# Patient Record
Sex: Male | Born: 1980 | Race: Black or African American | Hispanic: No | Marital: Single | Smoking: Current every day smoker
Health system: Southern US, Community
[De-identification: ages and names within clinical notes are randomized; demographics above are authoritative.]

## PROBLEM LIST (undated history)

## (undated) DIAGNOSIS — Z789 Other specified health status: Secondary | ICD-10-CM

## (undated) HISTORY — PX: NO PAST SURGERIES: SHX2092

## (undated) HISTORY — PX: OTHER SURGICAL HISTORY: SHX169

## (undated) HISTORY — PX: ABDOMINAL SURGERY: SHX537

---

## 2009-12-30 ENCOUNTER — Emergency Department (HOSPITAL_COMMUNITY): Admission: EM | Admit: 2009-12-30 | Discharge: 2009-12-30 | Payer: Self-pay | Admitting: Emergency Medicine

## 2011-11-21 ENCOUNTER — Emergency Department (HOSPITAL_COMMUNITY)
Admission: EM | Admit: 2011-11-21 | Discharge: 2011-11-22 | Disposition: A | Discharge status: Home / Self Care | Payer: Self-pay | Attending: Emergency Medicine | Admitting: Emergency Medicine

## 2011-11-21 ENCOUNTER — Emergency Department (HOSPITAL_COMMUNITY): Payer: Self-pay

## 2011-11-21 ENCOUNTER — Encounter (HOSPITAL_COMMUNITY): Payer: Self-pay | Admitting: *Deleted

## 2011-11-21 DIAGNOSIS — J069 Acute upper respiratory infection, unspecified: Secondary | ICD-10-CM | POA: Insufficient documentation

## 2011-11-21 DIAGNOSIS — R509 Fever, unspecified: Secondary | ICD-10-CM | POA: Insufficient documentation

## 2011-11-21 DIAGNOSIS — R10819 Abdominal tenderness, unspecified site: Secondary | ICD-10-CM | POA: Insufficient documentation

## 2011-11-21 DIAGNOSIS — K529 Noninfective gastroenteritis and colitis, unspecified: Secondary | ICD-10-CM

## 2011-11-21 DIAGNOSIS — R079 Chest pain, unspecified: Secondary | ICD-10-CM | POA: Insufficient documentation

## 2011-11-21 DIAGNOSIS — R1011 Right upper quadrant pain: Secondary | ICD-10-CM | POA: Insufficient documentation

## 2011-11-21 DIAGNOSIS — K5289 Other specified noninfective gastroenteritis and colitis: Secondary | ICD-10-CM | POA: Insufficient documentation

## 2011-11-21 LAB — CBC
HCT: 42.4 % (ref 39.0–52.0)
Hemoglobin: 15.1 g/dL (ref 13.0–17.0)
MCV: 82.3 fL (ref 78.0–100.0)
Platelets: 183 10*3/uL (ref 150–400)

## 2011-11-21 LAB — DIFFERENTIAL
Basophils Absolute: 0 10*3/uL (ref 0.0–0.1)
Eosinophils Absolute: 0.1 10*3/uL (ref 0.0–0.7)
Eosinophils Relative: 1 % (ref 0–5)
Lymphs Abs: 0.7 10*3/uL (ref 0.7–4.0)
Monocytes Absolute: 0.7 10*3/uL (ref 0.1–1.0)
Neutro Abs: 7 10*3/uL (ref 1.7–7.7)

## 2011-11-21 LAB — COMPREHENSIVE METABOLIC PANEL
Alkaline Phosphatase: 116 U/L (ref 39–117)
BUN: 11 mg/dL (ref 6–23)
CO2: 25 mEq/L (ref 19–32)
Chloride: 104 mEq/L (ref 96–112)
Creatinine, Ser: 0.86 mg/dL (ref 0.50–1.35)
GFR calc non Af Amer: >90 mL/min (ref >90)
Glucose, Bld: 93 mg/dL (ref 70–99)
Potassium: 4.2 mEq/L (ref 3.5–5.1)
Total Bilirubin: 0.3 mg/dL (ref 0.3–1.2)

## 2011-11-21 MED ORDER — PANTOPRAZOLE SODIUM 40 MG IV SOLR
40.0000 mg | Freq: Once | INTRAVENOUS | Status: AC
  Administered 2011-11-21: 40 mg via INTRAVENOUS
  Filled 2011-11-21: qty 40

## 2011-11-21 MED ORDER — SODIUM CHLORIDE 0.9 % IV SOLN
Freq: Once | INTRAVENOUS | Status: AC
  Administered 2011-11-21: 21:00:00 via INTRAVENOUS

## 2011-11-21 MED ORDER — PROMETHAZINE HCL 25 MG PO TABS: 25.0000 mg | ORAL_TABLET | Freq: Four times a day (QID) | ORAL | Status: AC | PRN

## 2011-11-21 MED ORDER — HYDROCOD POLST-CHLORPHEN POLST 10-8 MG/5ML PO LQCR: 5.0000 mL | Freq: Two times a day (BID) | ORAL | Status: DC

## 2011-11-21 MED ORDER — SODIUM CHLORIDE 0.9 % IV BOLUS (SEPSIS)
1000.0000 mL | Freq: Once | INTRAVENOUS | Status: AC
  Administered 2011-11-21: 1000 mL via INTRAVENOUS
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
## 2011-11-21 NOTE — ED Notes (Signed)
Pt reports abd pain with n/v/d since Thursday last week.  Pt also reports chest congestioin.

## 2011-11-21 NOTE — ED Notes (Signed)
Pt. Has had a cold since Thurs, then yesterday started with nausea, vomiting, and diarrhea.  He states he has not been taking in many fluids.  Still feels nauseated, dizzy, and general malaise.
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
## 2011-11-21 NOTE — Discharge Instructions (Signed)
Diet for Diarrhea, Adult Having frequent, runny stools (diarrhea) has many causes. Diarrhea may be caused or worsened by food or drink. Diarrhea may be relieved by changing your diet. IF YOU ARE NOT TOLERATING SOLID FOODS:  Drink enough water and fluids to keep your urine clear or pale yellow.   Avoid sugary drinks and sodas as well as milk-based beverages.   Avoid beverages containing caffeine and alcohol.   You may try rehydrating beverages. You can make your own by following this recipe:    tsp table salt.    tsp baking soda.   ? tsp salt substitute (potassium chloride).   1 tbs + 1 tsp sugar.   1 qt water.  As your stools become more solid, you can start eating solid foods. Add foods one at a time. If a certain food causes your diarrhea to get worse, avoid that food and try other foods. A low fiber, low-fat, and lactose-free diet is recommended. Small, frequent meals may be better tolerated.  Starches  Allowed:  White, French, and pita breads, plain rolls, buns, bagels. Plain muffins, matzo. Soda, saltine, or graham crackers. Pretzels, melba toast, zwieback. Cooked cereals made with water: cornmeal, farina, cream cereals. Dry cereals: refined corn, wheat, rice. Potatoes prepared any way without skins, refined macaroni, spaghetti, noodles, refined rice.   Avoid:  Bread, rolls, or crackers made with whole wheat, multi-grains, rye, bran seeds, nuts, or coconut. Corn tortillas or taco shells. Cereals containing whole grains, multi-grains, bran, coconut, nuts, or raisins. Cooked or dry oatmeal. Coarse wheat cereals, granola. Cereals advertised as "high-fiber." Potato skins. Whole grain pasta, wild or brown rice. Popcorn. Sweet potatoes/yams. Sweet rolls, doughnuts, waffles, pancakes, sweet breads.  Vegetables  Allowed: Strained tomato and vegetable juices. Most well-cooked and canned vegetables without seeds. Fresh: Tender lettuce, cucumber without the skin, cabbage, spinach, bean  sprouts.   Avoid: Fresh, cooked, or canned: Artichokes, baked beans, beet greens, broccoli, Brussels sprouts, corn, kale, legumes, peas, sweet potatoes. Cooked: Green or red cabbage, spinach. Avoid large servings of any vegetables, because vegetables shrink when cooked, and they contain more fiber per serving than fresh vegetables.  Fruit  Allowed: All fruit juices except prune juice. Cooked or canned: Apricots, applesauce, cantaloupe, cherries, fruit cocktail, grapefruit, grapes, kiwi, mandarin oranges, peaches, pears, plums, watermelon. Fresh: Apples without skin, ripe banana, grapes, cantaloupe, cherries, grapefruit, peaches, oranges, plums. Keep servings limited to  cup or 1 piece.   Avoid: Fresh: Apple with skin, apricots, mango, pears, raspberries, strawberries. Prune juice, stewed or dried prunes. Dried fruits, raisins, dates. Large servings of all fresh fruits.  Meat and Meat Substitutes  Allowed: Ground or well-cooked tender beef, ham, veal, lamb, pork, or poultry. Eggs, plain cheese. Fish, oysters, shrimp, lobster, other seafoods. Liver, organ meats.   Avoid: Tough, fibrous meats with gristle. Peanut butter, smooth or chunky. Cheese, nuts, seeds, legumes, dried peas, beans, lentils.  Milk  Allowed: Yogurt, lactose-free milk, kefir, drinkable yogurt, buttermilk, soy milk.   Avoid: Milk, chocolate milk, beverages made with milk, such as milk shakes.  Soups  Allowed: Bouillon, broth, or soups made from allowed foods. Any strained soup.   Avoid: Soups made from vegetables that are not allowed, cream or milk-based soups.  Desserts and Sweets  Allowed: Sugar-free gelatin, sugar-free frozen ice pops made without sugar alcohol.   Avoid: Plain cakes and cookies, pie made with allowed fruit, pudding, custard, cream pie. Gelatin, fruit, ice, sherbet, frozen ice pops. Ice cream, ice milk without nuts. Plain hard candy,   honey, jelly, molasses, syrup, sugar, chocolate syrup, gumdrops,  marshmallows.  Fats and Oils  Allowed: Avoid any fats and oils.   Avoid: Seeds, nuts, olives, avocados. Margarine, butter, cream, mayonnaise, salad oils, plain salad dressings made from allowed foods. Plain gravy, crisp bacon without rind.  Beverages  Allowed: Water, decaffeinated teas, oral rehydration solutions, sugar-free beverages.   Avoid: Fruit juices, caffeinated beverages (coffee, tea, soda or pop), alcohol, sports drinks, or lemon-lime soda or pop.  Condiments  Allowed: Ketchup, mustard, horseradish, vinegar, cream sauce, cheese sauce, cocoa powder. Spices in moderation: allspice, basil, bay leaves, celery powder or leaves, cinnamon, cumin powder, curry powder, ginger, mace, marjoram, onion or garlic powder, oregano, paprika, parsley flakes, ground pepper, rosemary, sage, savory, tarragon, thyme, turmeric.   Avoid: Coconut, honey.  Weight Monitoring: Weigh yourself every day. You should weigh yourself in the morning after you urinate and before you eat breakfast. Wear the same amount of clothing when you weigh yourself. Record your weight daily. Bring your recorded weights to your clinic visits. Tell your caregiver right away if you have gained 3 lb/1.4 kg or more in 1 day, 5 lb/2.3 kg in a week, or whatever amount you were told to report. SEEK IMMEDIATE MEDICAL CARE IF:   You are unable to keep fluids down.   You start to throw up (vomit) or diarrhea keeps coming back (persistent).   Abdominal pain develops, increases, or can be felt in one place (localizes).   You have an oral temperature above 102 F (38.9 C), not controlled by medicine.   Diarrhea contains blood or mucus.   You develop excessive weakness, dizziness, fainting, or extreme thirst.  MAKE SURE YOU:   Understand these instructions.   Will watch your condition.   Will get help right away if you are not doing well or get worse.  Document Released: 12/03/2003 Document Revised: 05/25/2011 Document Reviewed:  03/26/2009 [facility] Patient Information 2012 [place], [place].Upper Respiratory Infection, Adult An upper respiratory infection (URI) is also sometimes known as the common cold. The upper respiratory tract includes the nose, sinuses, throat, trachea, and bronchi. Bronchi are the airways leading to the lungs. Most people improve within 1 week, but symptoms can last up to 2 weeks. A residual cough may last even longer.  CAUSES Many different viruses can infect the tissues lining the upper respiratory tract. The tissues become irritated and inflamed and often become very moist. Mucus production is also common. A cold is contagious. You can easily spread the virus to others by oral contact. This includes kissing, sharing a glass, coughing, or sneezing. Touching your mouth or nose and then touching a surface, which is then touched by another person, can also spread the virus. SYMPTOMS  Symptoms typically develop 1 to 3 days after you come in contact with a cold virus. Symptoms vary from person to person. They may include:  Runny nose.   Sneezing.   Nasal congestion.   Sinus irritation.   Sore throat.   Loss of voice (laryngitis).   Cough.   Fatigue.   Muscle aches.   Loss of appetite.   Headache.   Low-grade fever.  DIAGNOSIS  You might diagnose your own cold based on familiar symptoms, since most people get a cold 2 to 3 times a year. Your caregiver can confirm this based on your exam. Most importantly, your caregiver can check that your symptoms are not due to another disease such as strep throat, sinusitis, pneumonia, asthma, or epiglottitis. Blood tests, throat tests,  and X-rays are not necessary to diagnose a common cold, but they may sometimes be helpful in excluding other more serious diseases. Your caregiver will decide if any further tests are required. RISKS AND COMPLICATIONS  You may be at risk for a more severe case of the common cold if you smoke cigarettes, have chronic  heart disease (such as heart failure) or lung disease (such as asthma), or if you have a weakened immune system. The very young and very old are also at risk for more serious infections. Bacterial sinusitis, middle ear infections, and bacterial pneumonia can complicate the common cold. The common cold can worsen asthma and chronic obstructive pulmonary disease (COPD). Sometimes, these complications can require emergency medical care and may be life-threatening. PREVENTION  The best way to protect against getting a cold is to practice good hygiene. Avoid oral or hand contact with people with cold symptoms. Wash your hands often if contact occurs. There is no clear evidence that vitamin C, vitamin E, echinacea, or exercise reduces the chance of developing a cold. However, it is always recommended to get plenty of rest and practice good nutrition. TREATMENT  Treatment is directed at relieving symptoms. There is no cure. Antibiotics are not effective, because the infection is caused by a virus, not by bacteria. Treatment may include:  Increased fluid intake. Sports drinks offer valuable electrolytes, sugars, and fluids.   Breathing heated mist or steam (vaporizer or shower).   Eating chicken soup or other clear broths, and maintaining good nutrition.   Getting plenty of rest.   Using gargles or lozenges for comfort.   Controlling fevers with ibuprofen or acetaminophen as directed by your caregiver.   Increasing usage of your inhaler if you have asthma.  Zinc gel and zinc lozenges, taken in the first 24 hours of the common cold, can shorten the duration and lessen the severity of symptoms. Pain medicines may help with fever, muscle aches, and throat pain. A variety of non-prescription medicines are available to treat congestion and runny nose. Your caregiver can make recommendations and may suggest nasal or lung inhalers for other symptoms.  HOME CARE INSTRUCTIONS   Only take over-the-counter or  prescription medicines for pain, discomfort, or fever as directed by your caregiver.   Use a warm mist humidifier or inhale steam from a shower to increase air moisture. This may keep secretions moist and make it easier to breathe.   Drink enough water and fluids to keep your urine clear or pale yellow.   Rest as needed.   Return to work when your temperature has returned to normal or as your caregiver advises. You may need to stay home longer to avoid infecting others. You can also use a face mask and careful hand washing to prevent spread of the virus.  SEEK MEDICAL CARE IF:   After the first few days, you feel you are getting worse rather than better.   You need your caregiver's advice about medicines to control symptoms.   You develop chills, worsening shortness of breath, or brown or red sputum. These may be signs of pneumonia.   You develop yellow or brown nasal discharge or pain in the face, especially when you bend forward. These may be signs of sinusitis.   You develop a fever, swollen neck glands, pain with swallowing, or white areas in the back of your throat. These may be signs of strep throat.  SEEK IMMEDIATE MEDICAL CARE IF:   You have a fever.   You  develop severe or persistent headache, ear pain, sinus pain, or chest pain.   You develop wheezing, a prolonged cough, cough up blood, or have a change in your usual mucus (if you have chronic lung disease).   You develop sore muscles or a stiff neck.  Document Released: 03/08/2001 Document Revised: 05/25/2011 Document Reviewed: 01/14/2011 [facility] Patient Information 2012 [place], [place].

## 2011-11-22 NOTE — ED Provider Notes (Signed)
Medical screening examination/treatment/procedure(s) were performed by non-physician practitioner and as supervising physician I was immediately available for consultation/collaboration.    Michille Mcelrath R Xzavier Swinger, MD 11/22/11 0041 

## 2011-11-22 NOTE — ED Provider Notes (Signed)
Medical screening examination/treatment/procedure(s) were performed by non-physician practitioner and as supervising physician I was immediately available for consultation/collaboration.    Celene Kras, MD 11/22/11 [phone]

## 2012-09-14 ENCOUNTER — Emergency Department (HOSPITAL_COMMUNITY)
Admission: EM | Admit: 2012-09-14 | Discharge: 2012-09-14 | Disposition: A | Discharge status: Home / Self Care | Payer: Medicaid Other | Attending: Emergency Medicine | Admitting: Emergency Medicine

## 2012-09-14 ENCOUNTER — Emergency Department (HOSPITAL_COMMUNITY): Payer: Medicaid Other

## 2012-09-14 DIAGNOSIS — R05 Cough: Secondary | ICD-10-CM | POA: Insufficient documentation

## 2012-09-14 DIAGNOSIS — J069 Acute upper respiratory infection, unspecified: Secondary | ICD-10-CM | POA: Insufficient documentation

## 2012-09-14 DIAGNOSIS — R1110 Vomiting, unspecified: Secondary | ICD-10-CM | POA: Insufficient documentation

## 2012-09-14 DIAGNOSIS — R059 Cough, unspecified: Secondary | ICD-10-CM | POA: Insufficient documentation

## 2012-09-14 DIAGNOSIS — R509 Fever, unspecified: Secondary | ICD-10-CM | POA: Insufficient documentation

## 2012-09-14 DIAGNOSIS — F17200 Nicotine dependence, unspecified, uncomplicated: Secondary | ICD-10-CM | POA: Insufficient documentation

## 2012-09-14 LAB — CBC WITH DIFFERENTIAL/PLATELET
Basophils Absolute: 0 10*3/uL (ref 0.0–0.1)
Eosinophils Absolute: 0 10*3/uL (ref 0.0–0.7)
Eosinophils Relative: 0 % (ref 0–5)
HCT: 38.9 % — ABNORMAL LOW (ref 39.0–52.0)
MCH: 28.5 pg (ref 26.0–34.0)
MCHC: 35 g/dL (ref 30.0–36.0)
Neutrophils Relative %: 84 % — ABNORMAL HIGH (ref 43–77)
Platelets: 147 10*3/uL — ABNORMAL LOW (ref 150–400)
RBC: 4.78 MIL/uL (ref 4.22–5.81)
WBC: 10.2 10*3/uL (ref 4.0–10.5)

## 2012-09-14 LAB — BASIC METABOLIC PANEL
CO2: 21 mEq/L (ref 19–32)
Chloride: 99 mEq/L (ref 96–112)
Creatinine, Ser: 1.41 mg/dL — ABNORMAL HIGH (ref 0.50–1.35)
GFR calc Af Amer: 75 mL/min — ABNORMAL LOW (ref >90)
GFR calc non Af Amer: 64 mL/min — ABNORMAL LOW (ref >90)
Glucose, Bld: 110 mg/dL — ABNORMAL HIGH (ref 70–99)
Potassium: 3.7 mEq/L (ref 3.5–5.1)
Sodium: 133 mEq/L — ABNORMAL LOW (ref 135–145)

## 2012-09-14 LAB — INFLUENZA PANEL BY PCR (TYPE A & B): H1N1 flu by pcr: DETECTED — AB

## 2012-09-14 MED ORDER — SODIUM CHLORIDE 0.9 % IV BOLUS (SEPSIS)
1000.0000 mL | Freq: Once | INTRAVENOUS | Status: AC
  Administered 2012-09-14: 1000 mL via INTRAVENOUS

## 2012-09-14 MED ORDER — ONDANSETRON HCL 4 MG PO TABS: 4.0000 mg | ORAL_TABLET | Freq: Four times a day (QID) | ORAL | Status: DC

## 2012-09-14 MED ORDER — ALBUTEROL SULFATE HFA 108 (90 BASE) MCG/ACT IN AERS
2.0000 | INHALATION_SPRAY | RESPIRATORY_TRACT | Status: DC | PRN
  Administered 2012-09-14: 2 via RESPIRATORY_TRACT
  Filled 2012-09-14: qty 6.7

## 2012-09-14 MED ORDER — IBUPROFEN 800 MG PO TABS
800.0000 mg | ORAL_TABLET | Freq: Once | ORAL | Status: AC
  Administered 2012-09-14: 800 mg via ORAL
  Filled 2012-09-14: qty 1

## 2012-09-14 MED ORDER — DEXTROMETHORPHAN POLISTIREX 30 MG/5ML PO LQCR: 60.0000 mg | ORAL | Status: DC | PRN

## 2012-09-14 MED ORDER — AZITHROMYCIN 250 MG PO TABS: 250.0000 mg | ORAL_TABLET | Freq: Every day | ORAL | Status: DC

## 2012-09-14 MED ORDER — AZITHROMYCIN 250 MG PO TABS
500.0000 mg | ORAL_TABLET | Freq: Once | ORAL | Status: AC
  Administered 2012-09-14: 500 mg via ORAL
  Filled 2012-09-14: qty 2

## 2012-09-14 MED ORDER — ONDANSETRON HCL 4 MG/2ML IJ SOLN
4.0000 mg | Freq: Once | INTRAMUSCULAR | Status: AC
  Administered 2012-09-14: 4 mg via INTRAVENOUS
  Filled 2012-09-14: qty 2
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
## 2012-09-14 NOTE — ED Notes (Signed)
[id number]<ON> Expected date:<BR> Expected time:<BR> Means of arrival:<BR> Comments:<BR> Hold for bed 5

## 2012-09-14 NOTE — ED Provider Notes (Signed)
Medical screening examination/treatment/procedure(s) were performed by non-physician practitioner and as supervising physician I was immediately available for consultation/collaboration.  Cincere Deprey L Angla Delahunt, MD 09/14/12 1624 
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
## 2012-09-14 NOTE — ED Notes (Signed)
[id number]<AV> Expected date:09/14/12<BR> Expected time: 3:37 AM<BR> Means of arrival:Ambulance<BR> Comments:<BR> fever

## 2012-09-14 NOTE — ED Notes (Signed)
Pt began fever yesterday with n and v cough and headache 103.4 temp per pt. Pt took tylenol aprox 1 hour ago

## 2013-02-22 IMAGING — CR DG CHEST 2V
2 series · 2 of 2 positions shown · non-contrast
Comparison: 12/30/2009

CLINICAL DATA: Cough, fever, chest pain

CHEST - 2 VIEW

[w chest pa]
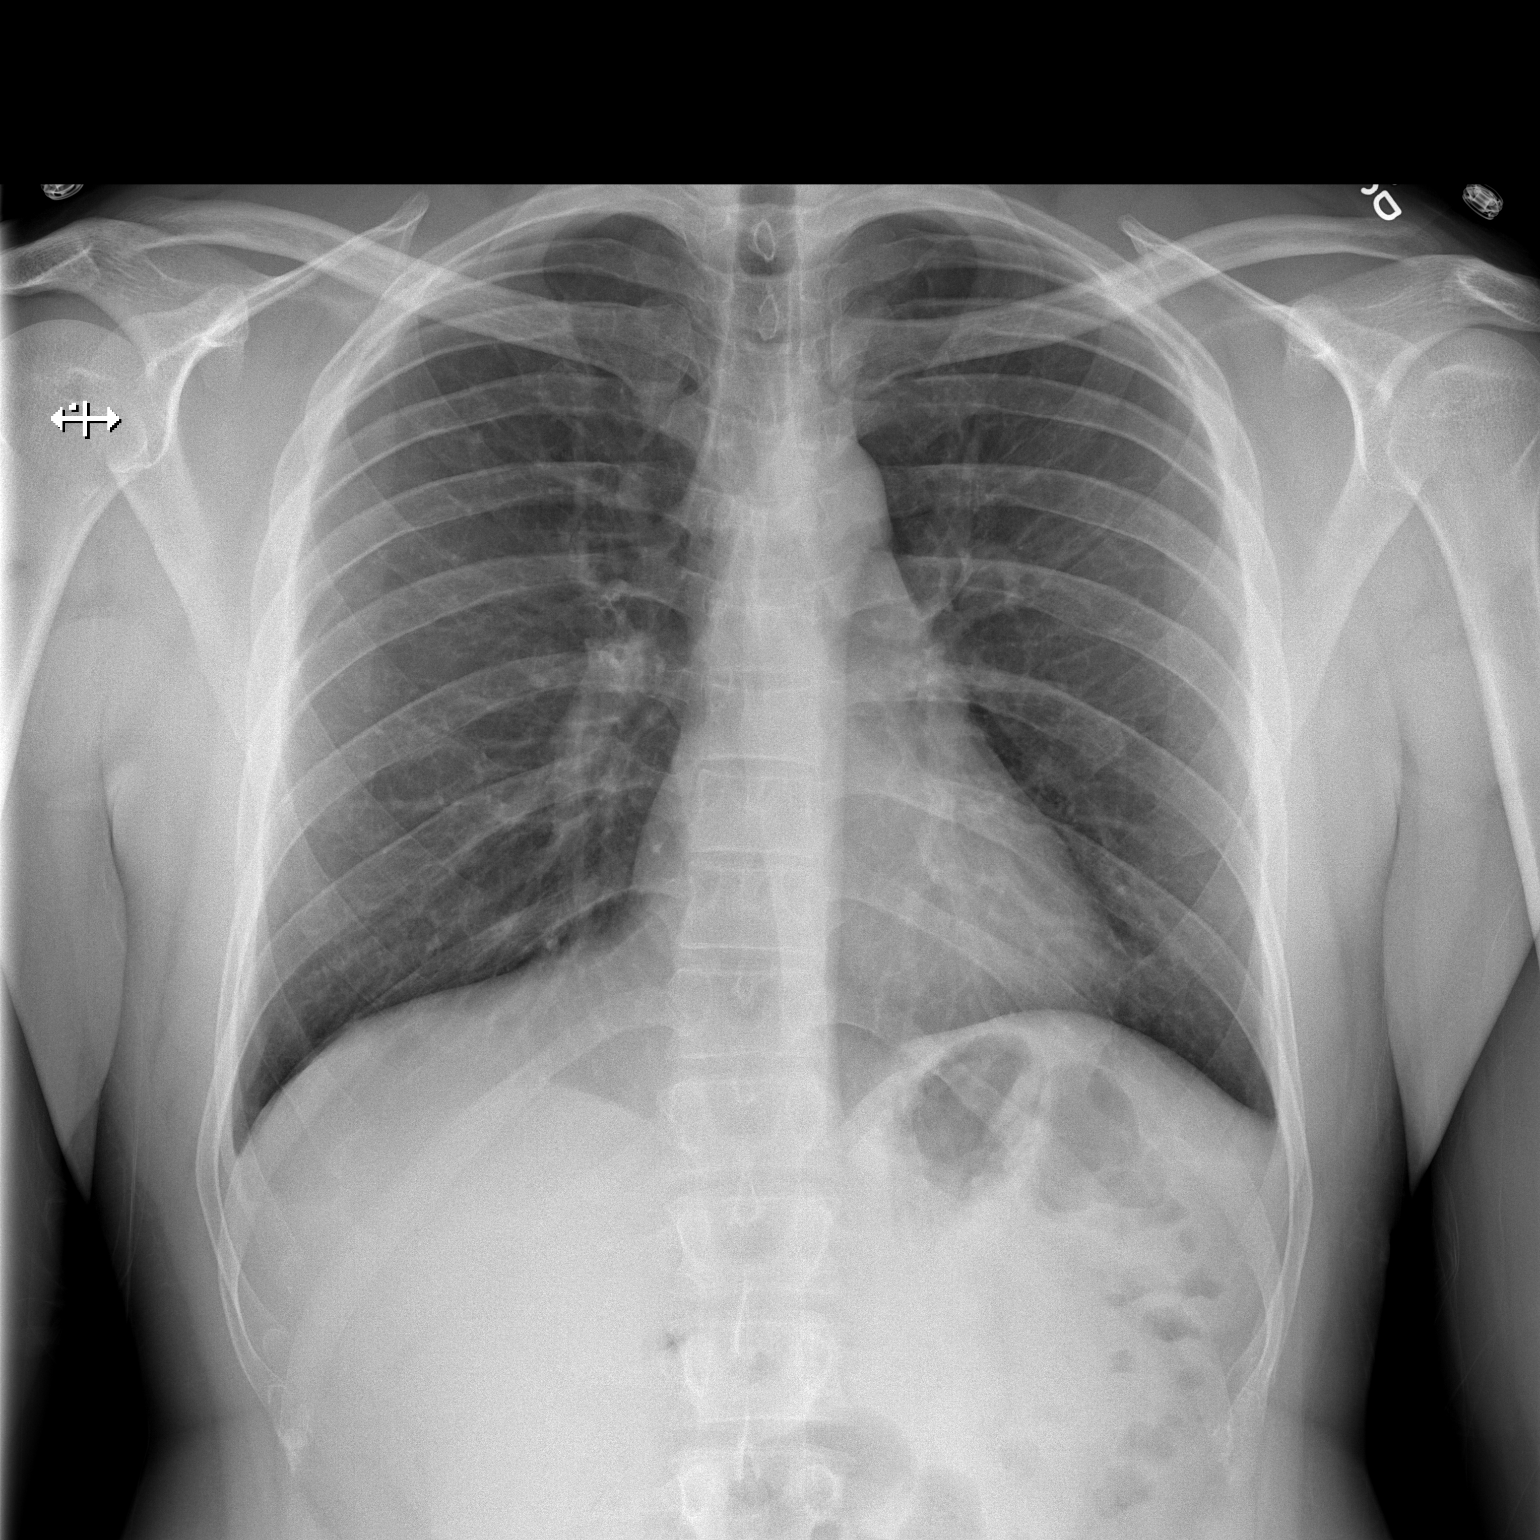

[w chest lat]
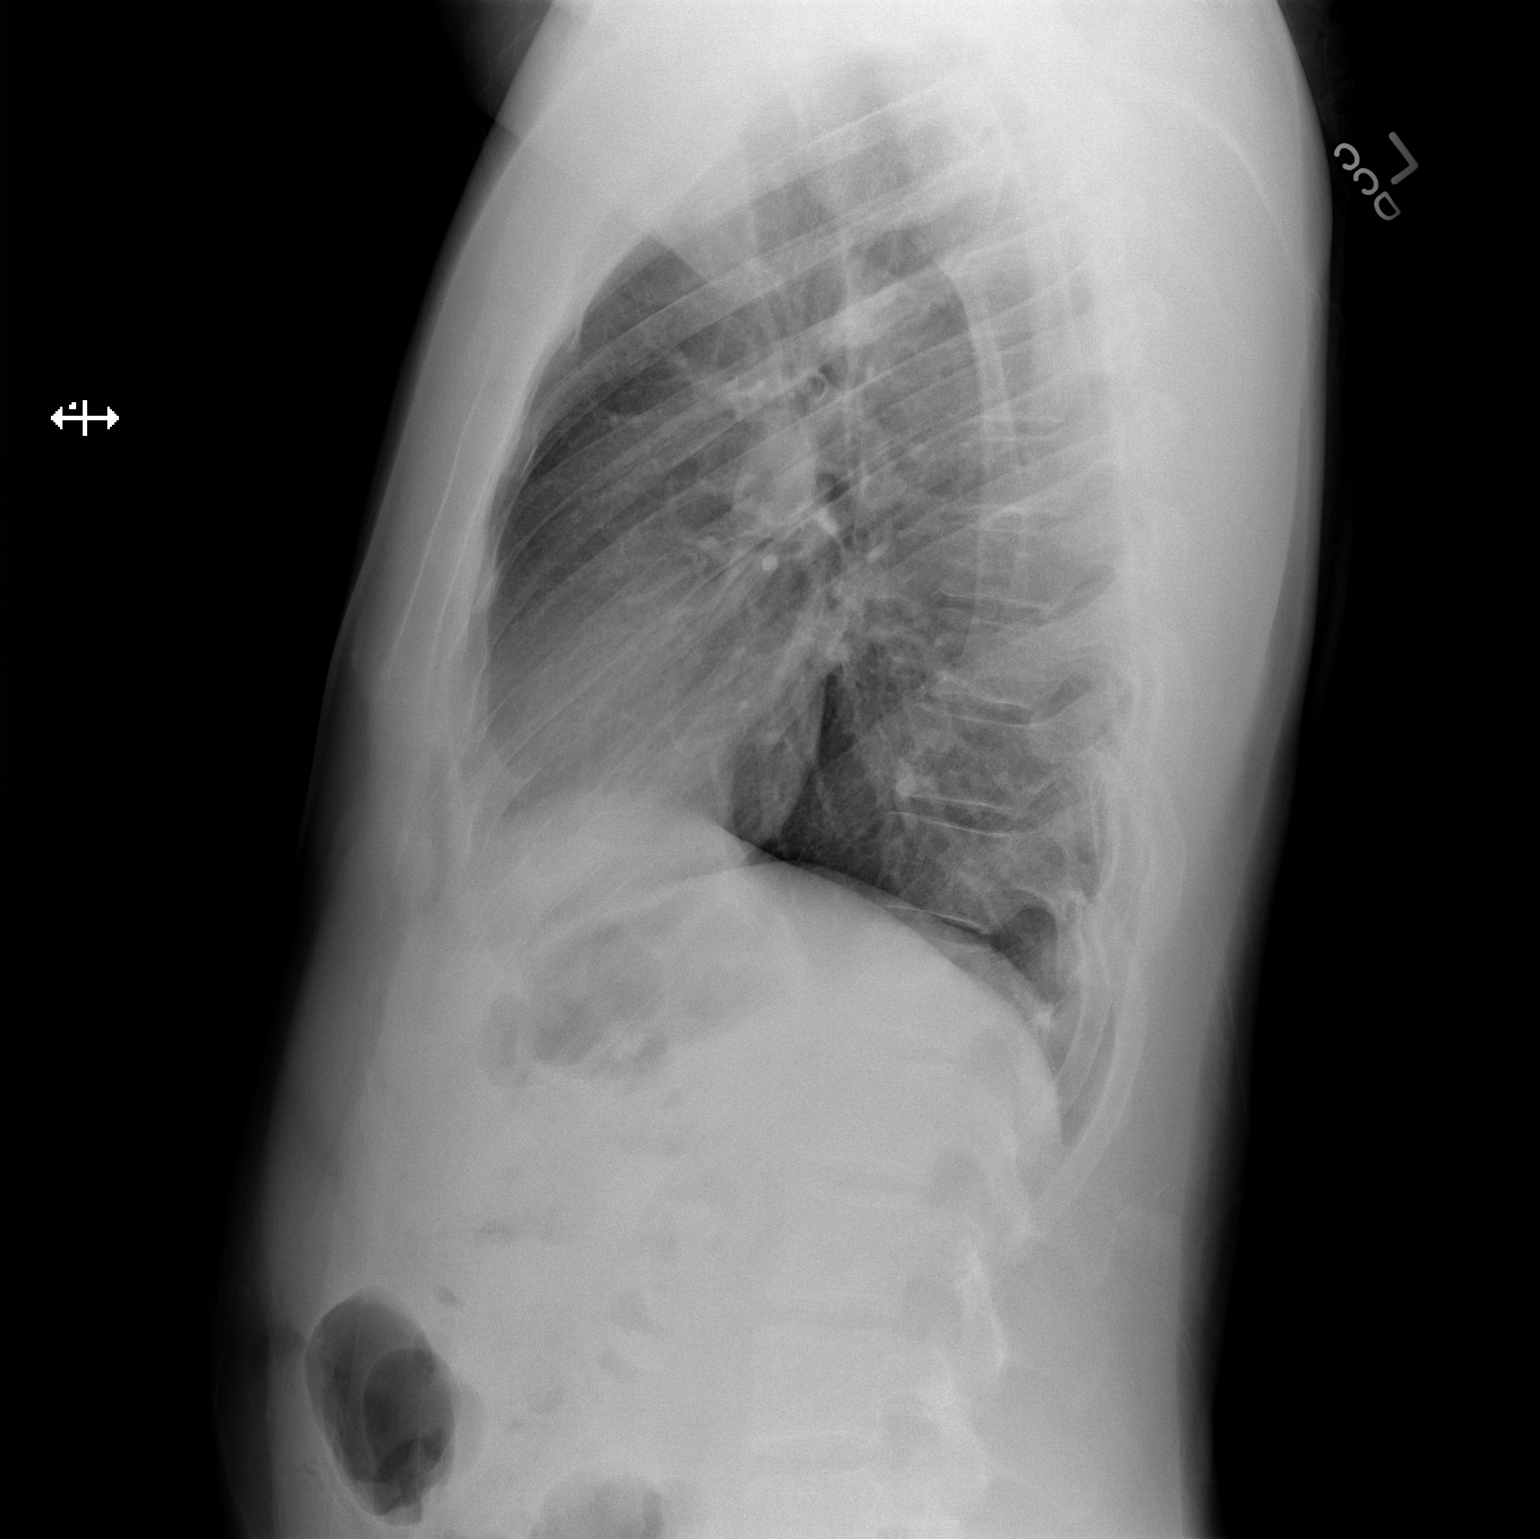

[2 of 2 positions shown; findings below may reference images not displayed]

FINDINGS: Lungs clear.  Heart size and pulmonary vascularity
normal.  No effusion.  Visualized bones unremarkable.
IMPRESSION: No acute disease

## 2013-02-22 IMAGING — US US ABDOMEN COMPLETE
1 series · 14 of 25 positions shown · non-contrast
Comparison: None.

CLINICAL DATA: Right upper quadrant pain.  Elevated LFTs.

COMPLETE ABDOMINAL ULTRASOUND

[Series 1: us abdomen complete · 0.25mm/px · 14 of 56 slices shown]
[im 1/56]
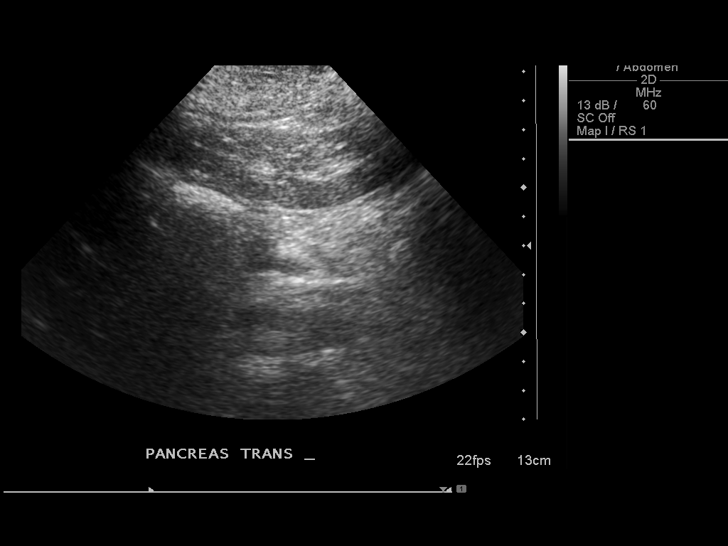
[im 5/56]
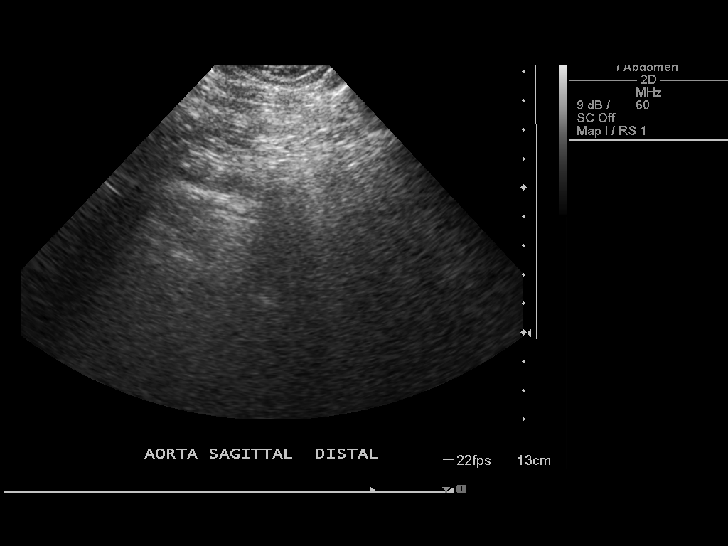
[im 10/56]
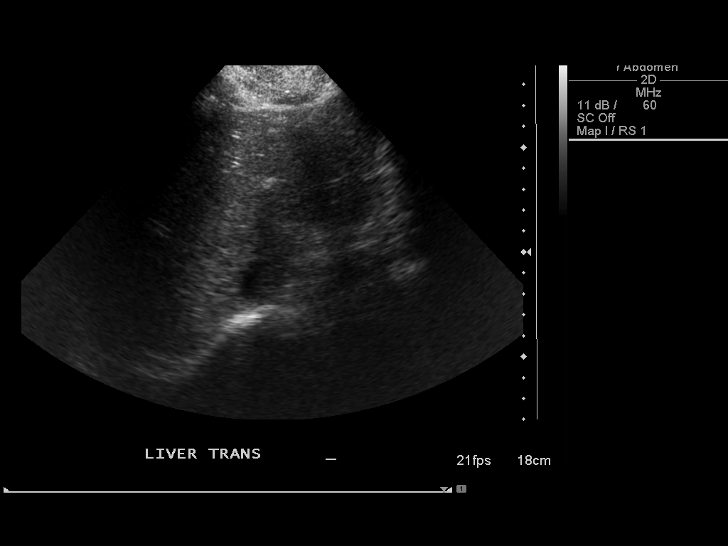
[im 14/56]
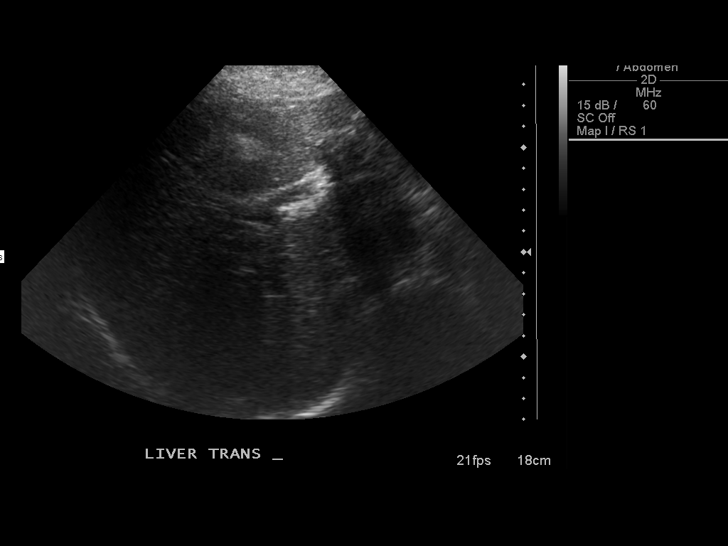
[im 19/56]
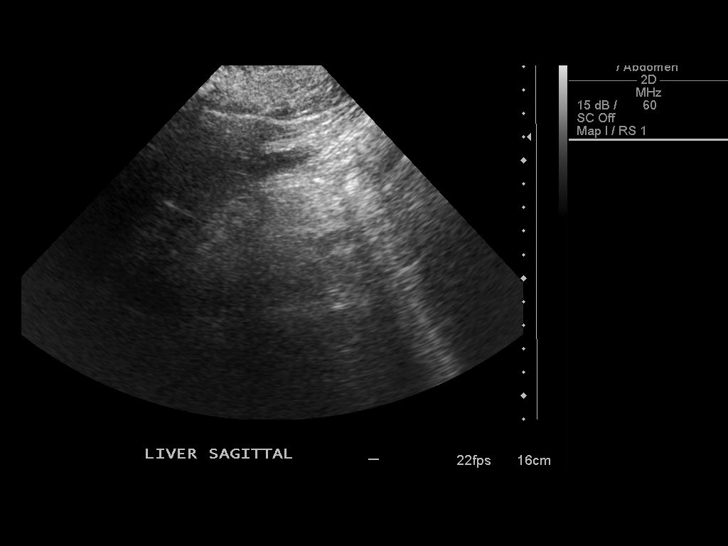
[im 21/56]
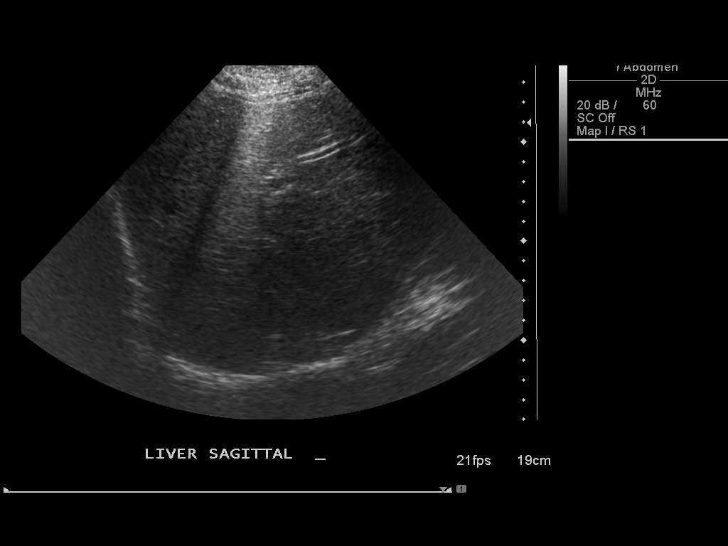
[im 26/56]
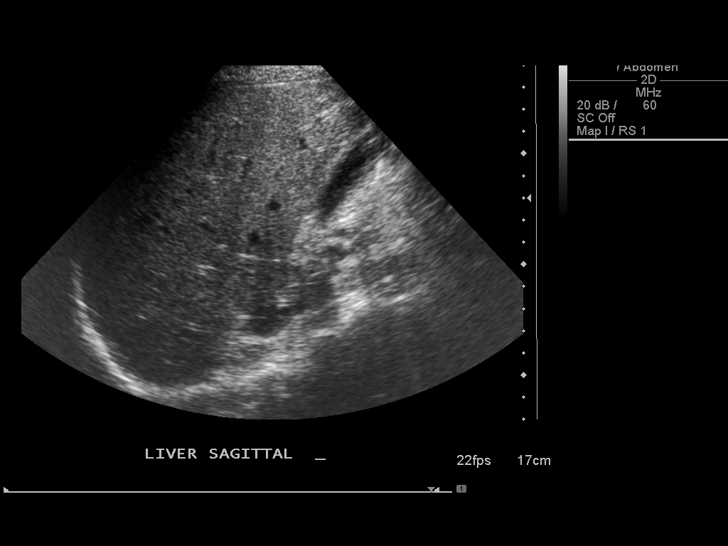
[im 30/56]
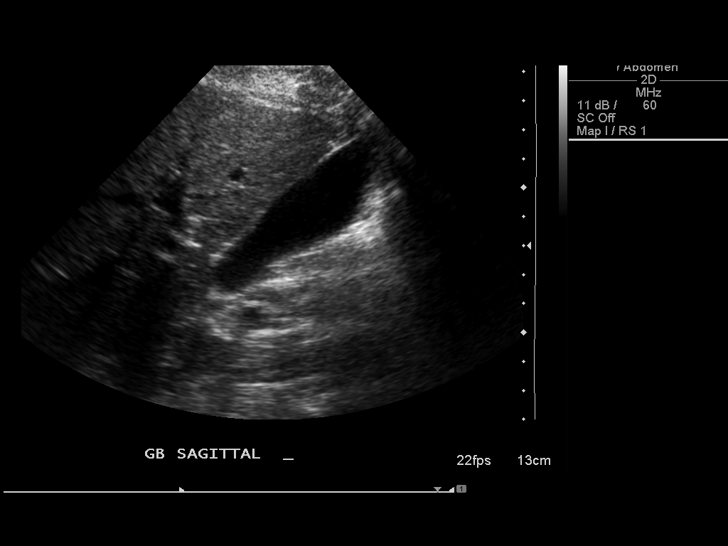
[im 35/56]
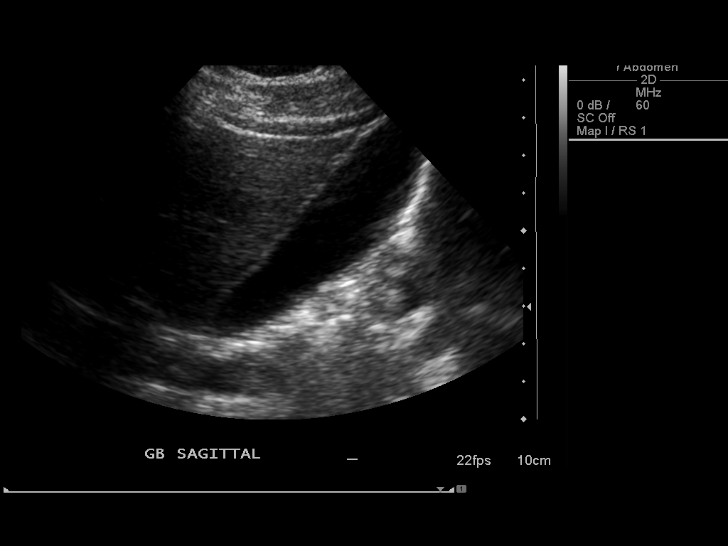
[im 37/56]
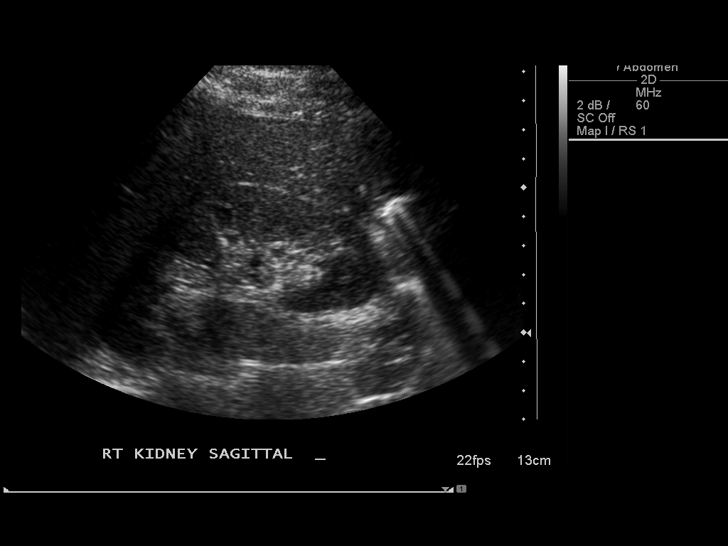
[im 42/56]
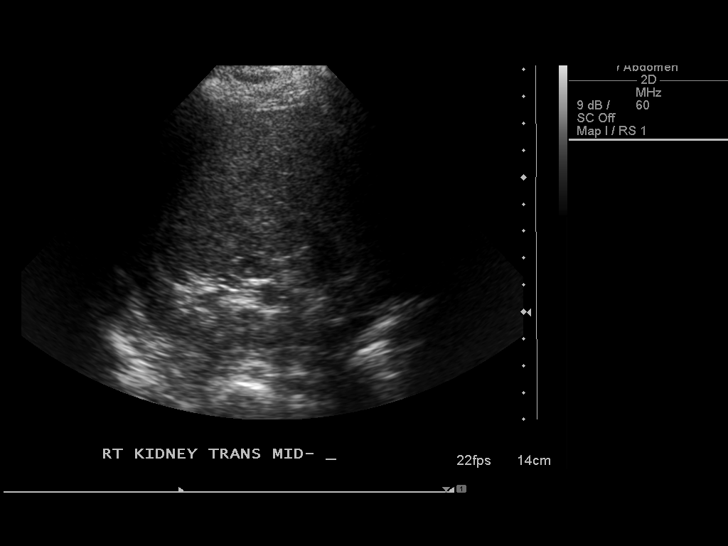
[im 46/56]
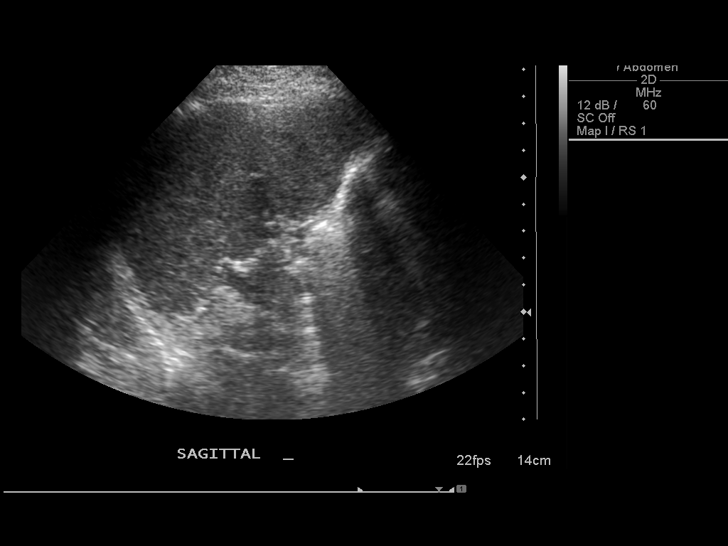
[im 51/56]
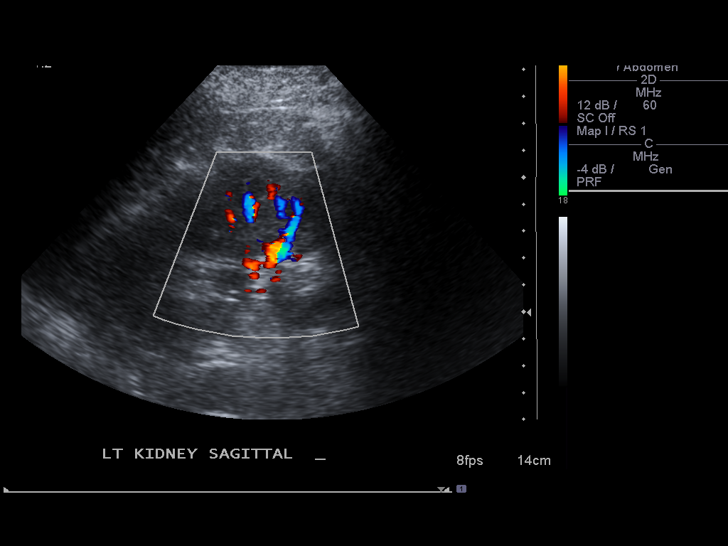
[im 56/56]
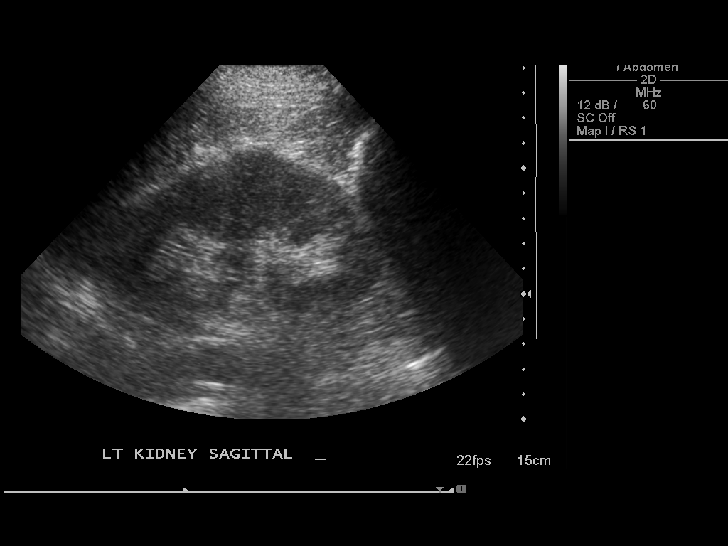

[14 of 25 positions shown; findings below may reference images not displayed]

FINDINGS: Gallbladder:  No gallstones, gallbladder wall thickening, or
pericholecystic fluid.

Common bile duct:  Measures 0.2 cm.

Liver:  Liver parenchyma is slightly heterogeneous but no focal
abnormality.

IVC:  Appears normal.

Pancreas:  Limited evaluation due to bowel gas.

Spleen:  Measures 9.0 cm in length.

Right Kidney:  Right kidney measures 10.4 cm in length.  Normal
renal echotexture.  Negative for hydronephrosis.

Left Kidney:  Left kidney measures 11.7 cm in length without
hydronephrosis.

Abdominal aorta:  Limited evaluation.
IMPRESSION: Limited evaluation due to bowel gas.  No acute findings.

## 2013-12-15 ENCOUNTER — Encounter (HOSPITAL_COMMUNITY): Payer: Self-pay | Admitting: Emergency Medicine

## 2013-12-15 ENCOUNTER — Emergency Department (HOSPITAL_COMMUNITY)
Admission: EM | Admit: 2013-12-15 | Discharge: 2013-12-15 | Disposition: A | Discharge status: Home / Self Care | Payer: Medicaid Other | Attending: Emergency Medicine | Admitting: Emergency Medicine

## 2013-12-15 ENCOUNTER — Emergency Department (HOSPITAL_COMMUNITY): Payer: Medicaid Other

## 2013-12-15 DIAGNOSIS — J069 Acute upper respiratory infection, unspecified: Secondary | ICD-10-CM | POA: Insufficient documentation

## 2013-12-15 DIAGNOSIS — B349 Viral infection, unspecified: Secondary | ICD-10-CM

## 2013-12-15 DIAGNOSIS — F17200 Nicotine dependence, unspecified, uncomplicated: Secondary | ICD-10-CM | POA: Insufficient documentation

## 2013-12-15 DIAGNOSIS — A088 Other specified intestinal infections: Secondary | ICD-10-CM | POA: Insufficient documentation

## 2013-12-15 DIAGNOSIS — R0789 Other chest pain: Secondary | ICD-10-CM | POA: Insufficient documentation

## 2013-12-15 DIAGNOSIS — A084 Viral intestinal infection, unspecified: Secondary | ICD-10-CM

## 2013-12-15 DIAGNOSIS — B9789 Other viral agents as the cause of diseases classified elsewhere: Secondary | ICD-10-CM | POA: Insufficient documentation

## 2013-12-15 MED ORDER — ONDANSETRON 4 MG PO TBDP
4.0000 mg | ORAL_TABLET | Freq: Once | ORAL | Status: AC
  Administered 2013-12-15: 4 mg via ORAL
  Filled 2013-12-15: qty 1

## 2013-12-15 MED ORDER — ONDANSETRON HCL 4 MG PO TABS: 4.0000 mg | ORAL_TABLET | Freq: Four times a day (QID) | ORAL | Status: AC

## 2013-12-15 MED ORDER — AZITHROMYCIN 250 MG PO TABS: 250.0000 mg | ORAL_TABLET | Freq: Every day | ORAL | Status: AC

## 2013-12-15 MED ORDER — ALBUTEROL SULFATE HFA 108 (90 BASE) MCG/ACT IN AERS
2.0000 | INHALATION_SPRAY | Freq: Once | RESPIRATORY_TRACT | Status: AC
Start: 2013-12-15 — End: 2013-12-15
  Administered 2013-12-15: 2 via RESPIRATORY_TRACT
  Filled 2013-12-15: qty 6.7

## 2013-12-15 NOTE — Discharge Instructions (Signed)
Please rest and stay hydrated Please take medications as prescribed Please avoid any physical or strenuous activity Please take with a light diet for next couple of days-no grease, fat, fried foods Please call and setup an appointment with health and wellness Center to be reassessed Please stop smoking! Please continue to monitor symptoms closely and if symptoms are to worsen or change (fever greater than 101, chills, nausea, vomiting, diarrhea, chest pain, shortness of breath, difficulty breathing, dizziness, room spinning sensation, stomach pain, inability to keep food and fluids down, blood in stools, black tarry stools, coughing up blood) please report back to the ED immediately  Upper Respiratory Infection, Adult An upper respiratory infection (URI) is also known as the common cold. It is often caused by a type of germ (virus). Colds are easily spread (contagious). You can pass it to others by kissing, coughing, sneezing, or drinking out of the same glass. Usually, you get better in 1 or 2 weeks.  HOME CARE   Only take medicine as told by your doctor.  Use a warm mist humidifier or breathe in steam from a hot shower.  Drink enough water and fluids to keep your pee (urine) clear or pale yellow.  Get plenty of rest.  Return to work when your temperature is back to normal or as told by your doctor. You may use a face mask and wash your hands to stop your cold from spreading. GET HELP RIGHT AWAY IF:   After the first few days, you feel you are getting worse.  You have questions about your medicine.  You have chills, shortness of breath, or brown or red spit (mucus).  You have yellow or brown snot (nasal discharge) or pain in the face, especially when you bend forward.  You have a fever, puffy (swollen) neck, pain when you swallow, or white spots in the back of your throat.  You have a bad headache, ear pain, sinus pain, or chest pain.  You have a high-pitched whistling sound when  you breathe in and out (wheezing).  You have a lasting cough or cough up blood.  You have sore muscles or a stiff neck. MAKE SURE YOU:   Understand these instructions.  Will watch your condition.  Will get help right away if you are not doing well or get worse. Document Released: 02/29/2008 Document Revised: 12/05/2011 Document Reviewed: 01/17/2011 [facility] Patient Information 2014 [place], [place]. Viral Gastroenteritis Viral gastroenteritis is also known as stomach flu. This condition affects the stomach and intestinal tract. It can cause sudden diarrhea and vomiting. The illness typically lasts 3 to 8 days. Most people develop an immune response that eventually gets rid of the virus. While this natural response develops, the virus can make you quite ill. CAUSES  Many different viruses can cause gastroenteritis, such as rotavirus or noroviruses. You can catch one of these viruses by consuming contaminated food or water. You may also catch a virus by sharing utensils or other personal items with an infected person or by touching a contaminated surface. SYMPTOMS  The most common symptoms are diarrhea and vomiting. These problems can cause a severe loss of body fluids (dehydration) and a body salt (electrolyte) imbalance. Other symptoms may include:  Fever.  Headache.  Fatigue.  Abdominal pain. DIAGNOSIS  Your caregiver can usually diagnose viral gastroenteritis based on your symptoms and a physical exam. A stool sample may also be taken to test for the presence of viruses or other infections. TREATMENT  This illness typically goes  away on its own. Treatments are aimed at rehydration. The most serious cases of viral gastroenteritis involve vomiting so severely that you are not able to keep fluids down. In these cases, fluids must be given through an intravenous line (IV). HOME CARE INSTRUCTIONS   Drink enough fluids to keep your urine clear or pale yellow. Drink small amounts of  fluids frequently and increase the amounts as tolerated.  Ask your caregiver for specific rehydration instructions.  Avoid:  Foods high in sugar.  Alcohol.  Carbonated drinks.  Tobacco.  Juice.  Caffeine drinks.  Extremely hot or cold fluids.  Fatty, greasy foods.  Too much intake of anything at one time.  Dairy products until 24 to 48 hours after diarrhea stops.  You may consume probiotics. Probiotics are active cultures of beneficial bacteria. They may lessen the amount and number of diarrheal stools in adults. Probiotics can be found in yogurt with active cultures and in supplements.  Wash your hands well to avoid spreading the virus.  Only take over-the-counter or prescription medicines for pain, discomfort, or fever as directed by your caregiver. Do not give aspirin to children. Antidiarrheal medicines are not recommended.  Ask your caregiver if you should continue to take your regular prescribed and over-the-counter medicines.  Keep all follow-up appointments as directed by your caregiver. SEEK IMMEDIATE MEDICAL CARE IF:   You are unable to keep fluids down.  You do not urinate at least once every 6 to 8 hours.  You develop shortness of breath.  You notice blood in your stool or vomit. This may look like coffee grounds.  You have abdominal pain that increases or is concentrated in one small area (localized).  You have persistent vomiting or diarrhea.  You have a fever.  The patient is a child younger than 3 months, and he or she has a fever.  The patient is a child older than 3 months, and he or she has a fever and persistent symptoms.  The patient is a child older than 3 months, and he or she has a fever and symptoms suddenly get worse.  The patient is a baby, and he or she has no tears when crying. MAKE SURE YOU:   Understand these instructions.  Will watch your condition.  Will get help right away if you are not doing well or get  worse. Document Released: 09/12/2005 Document Revised: 12/05/2011 Document Reviewed: 06/29/2011 [facility] Patient Information 2014 [place], [place].   Emergency Department Resource Guide 1) Find a Doctor and Pay Out of Pocket Although you won't have to find out who is covered by your insurance plan, it is a good idea to ask around and get recommendations. You will then need to call the office and see if the doctor you have chosen will accept you as a new patient and what types of options they offer for patients who are self-pay. Some doctors offer discounts or will set up payment plans for their patients who do not have insurance, but you will need to ask so you aren't surprised when you get to your appointment.  2) Contact Your Local Health Department Not all health departments have doctors that can see patients for sick visits, but many do, so it is worth a call to see if yours does. If you don't know where your local health department is, you can check in your phone book. The CDC also has a tool to help you locate your state's health department, and many state websites also have  listings of all of their local health departments.  3) Find a Walk-in Clinic If your illness is not likely to be very severe or complicated, you may want to try a walk in clinic. These are popping up all over the country in pharmacies, drugstores, and shopping centers. They're usually staffed by nurse practitioners or physician assistants that have been trained to treat common illnesses and complaints. They're usually fairly quick and inexpensive. However, if you have serious medical issues or chronic medical problems, these are probably not your best option.  No Primary Care Doctor: - Call Health Connect at  [phone] - they can help you locate a primary care doctor that  accepts your insurance, provides certain services, etc. - Physician Referral Service- [phone]  Chronic Pain Problems: Organization          Address  Phone   Notes  [facility]  [phone] Patients need to be referred by their primary care doctor.   Medication Assistance: Organization         Address  Phone   Notes  [facility] Medication [facility] [address] [place] [place], [place] [address] [phone] --Must be a resident of [facility] -- Must have NO insurance coverage whatsoever (no Medicaid/ Medicare, etc.) -- The pt. MUST have a primary care doctor that directs their care regularly and follows them in the community   [facility]  [phone]   [employer]  [phone]    Agencies that provide inexpensive medical care: Organization         Address  Phone   Notes  [facility]  [phone]   [facility]    [phone]   [facility] [address] [place], [place] [address] [phone]   [facility] [address] [place] [place] [phone]   Planned Parenthood    [phone]   [facility]    [phone]   [facility] and [facility]  [address], [place] Phone:  [phone], Fax:  [phone] Hours of Operation:  9 am - 6 pm, M-F.  Also accepts Medicaid/Medicare and self-pay.  [facility]  [address], [place] Phone: [phone], Fax: [phone]. Hours of Operation:  8:30 am - 5:30 pm, M-F.  Also accepts Medicaid and self-pay.  [facility] [place] [address] [place] Phone: [phone]   [facility] [address] Natasha Bence [place], [place] [phone], Ext. [phone] Mondays & Thursdays: 7-9 AM.  First 15 patients are seen on a first come, first serve basis.    Medicaid-accepting Cli Surgery Center Providers:  Organization         Address  Phone   Notes  [facility] [address], [place] [phone] Also accepts self-pay patients.  [facility] [address] Laurell Josephs [place], [place]  [phone]   [facility] [address], [place] [phone]   [facility] [address], [place] [phone]   Renaye Rakers [address], [place]   [phone] Only accepts [place] Access [place] patients after they have their name applied to their card.   Self-Pay (no insurance) in [facility]:  [employer]  Notes  Sickle Cell Patients, [facility] [address], [place] [phone]   [facility] [address], [place] [phone]   [facility]  [address], [place] [phone]) [phone]   [facility]/Dr. Osei-Bonsu  [address] [place] [address], [place] or [phone], [place] [phone] Phone number for both [place] and [place] locations is the same.  [facility] [address], [place] [phone]   [facility] [place] [address] [place] [address], [place] or [address] [phone]   [facility] [address], [place] [phone]) [phone], phone; [phone], fax Sees patients 1st and 3rd Saturday of every month.  Must not qualify for public or private insurance (i.e. Medicaid, Medicare, [place] Health Choice, Veterans' Benefits)  Household income should be no more than 200% of the poverty level The clinic cannot treat you if you are pregnant or think you are pregnant  Sexually transmitted diseases are not treated at the clinic.    Dental Care: Organization         Address  Phone  Notes  [facility] [address], [place] [phone]) [phone] Accepts children up to age 35 who are enrolled in [place] or [place] Health Choice; pregnant women with a Medicaid card; and  children who have applied for Medicaid or [place] Health Choice, but were declined, whose parents can pay a reduced fee at time of service.  [facility] [place]  [address], [place] [phone] Accepts children up to age 35 who are enrolled in [place] or [place] Health Choice; pregnant women with a Medicaid card; and children who have applied for Medicaid or [place] Health Choice, but were declined, whose parents can pay a reduced fee at time of service.  [facility]  [address], [place] [phone]) [phone] Patients are seen by appointment only. Walk-ins are not accepted. [facility] will see patients 35 years of age and older. Monday - Tuesday (8am-5pm) Most Wednesdays (8:30-5pm) $30 per visit, cash only  [facility]  [address], [facility] [place] [phone]) [phone] Patients are seen by appointment only. Walk-ins are not accepted. [facility] will see patients 35 years of age and older. One Wednesday Evening (Monthly: Volunteer Based).  $30 per visit, cash only  [employer] [facility]  [phone] for adults; Children under age 314, call Graduate Pediatric Dentistry at [phone]. Children aged 754-14, please call [phone] to request a pediatric application.  Dental services are provided in all areas of dental care including fillings, crowns and bridges, complete and partial dentures, implants, gum treatment, root canals, and extractions. Preventive care is also provided. Treatment is provided to both adults and children. Patients are selected via a lottery and there is often a waiting list.   [facility] [address], [place]  [phone] [email or web address]   [facility] [address], [place], [place] [phone], Ext. [phone] Second and Fourth Thursday of each month, opens at 6:30 AM; Clinic ends at 9 AM.  Patients are seen on a first-come first-served  basis, and a limited number are seen during each clinic.   [facility]  [address], [place], [place] [phone]  Eligibility Requirements You must have lived in [place], [place], or [place] counties for at least the last three months.   You cannot be eligible for state or federal sponsored [place], including [employer], [place], or [employer].   You generally cannot be eligible for healthcare insurance through your employer.    How to apply: Eligibility screenings are held every Tuesday and Wednesday afternoon from 1:00 pm until 4:00 pm. You do not need an appointment for the interview!  [facility] [address], [place], [place] [phone]   [facility] Health Department  [phone]   [facility] Health [facility]  [phone]   [facility]  [phone]    Behavioral Health Resources in the Community: Intensive Outpatient Programs Organization         Address  Phone  Notes  [facility] [address], [place], [place] [phone]   [facility] Outpatient [address] [place], [place] [phone]   [facility] [address] [place], [place]  [phone]   [facility] Mental Health [address]  [place], [place] [phone] or [phone]   Substance Abuse Resources Organization         Address  Phone  Notes  [facility] Drug Services  [phone]   [facility]  [phone]   [facility] [place]  [phone]   Floydene Flock  [phone]   Residential & Outpatient Substance Abuse Program  [phone]   Psychological Services Organization         Address  Phone  Notes  [facility] Behavioral Health  [phone]   [facility] Services  [phone]   [facility] Mental Health [address] [place] [phone] or [phone]    Mobile Crisis Teams Organization          Address  Phone  Notes  [facility], Mobile Crisis Care Unit  [phone]   [facility]  [address] [place], [place] [phone]   Doristine Locks [address] [place] [phone]    Self-Help/Support Groups Organization         Address  Phone             Notes  [facility]oc[facility] of [place] - variety of support groups  [phone]- I7437963 Call for more information  Narcotics Anonymous (NA), [facility] [address], [employer] [place]  2 meetings at this location   Statistician         Address  Phone  Notes  [facility] [address],    [place]  [phone]   [facility]  [address], [place] [address], [place], [place] [phone]   [facility] [address] [place], [place] [phone] Admissions: 8am-3pm M-F  [facility] [address],    [place], [place] [phone]   [facility] [address] Starling Manns [place], [place] [phone]   [facility] [address]  [place], [place] [phone]   [facility] - Intensive Outpatient [address]urell Josephs [address], [place], [place] [phone]   [facility] ([facility] [address]  [place], [place] [phone] or [phone]   [facility] ([facility]) [address], [place], [place] [phone] Accepts Medicaid  [facility] [address]  [place] [phone] Substance Abuse/Addiction Treatment   [facility] Resources Organization  Address  Phone  Notes  [facility]  [phone]   Domenic Schwab, PhD [address] Arlis Porta [place], [place]   [phone] or [phone]   [place], [place] [phone]   [address], [place], [place] [phone] Insurance/Medicaid/sponsorship  through [facility] and Families [address]                                    [place], [place] [phone] [place] [address], [place] [phone]    Dr. Adele Schilder  [phone]   [facility] Dept. 1) [address], [place] 2) [place] 3)  [place] [address], [place] [phone]  [phone]   [place] [phone] or [phone] (After Hours)
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
## 2013-12-15 NOTE — ED Notes (Signed)
He c/o congested cough; he coughs so hard sometimes he vomits.  He further states he took Nyquil yesterday evening, which made him vomit even more.  He also c/o throat and sinus area soreness.  He is in no distress and ambulates without difficulty.

## 2013-12-17 NOTE — ED Provider Notes (Signed)
Medical screening examination/treatment/procedure(s) were performed by non-physician practitioner and as supervising physician I was immediately available for consultation/collaboration.   EKG Interpretation None        Suzi RootsKevin E Pedram Goodchild, MD 12/17/13 1106

## 2016-10-08 ENCOUNTER — Encounter (HOSPITAL_COMMUNITY): Payer: Self-pay

## 2016-10-08 ENCOUNTER — Emergency Department (HOSPITAL_COMMUNITY): Payer: Self-pay

## 2016-10-08 ENCOUNTER — Observation Stay (HOSPITAL_COMMUNITY)
Admission: EM | Admit: 2016-10-08 | Discharge: 2016-10-09 | Disposition: A | Discharge status: Home / Self Care | Payer: Self-pay | Attending: General Surgery | Admitting: General Surgery

## 2016-10-08 DIAGNOSIS — T148XXA Other injury of unspecified body region, initial encounter: Secondary | ICD-10-CM | POA: Diagnosis present

## 2016-10-08 DIAGNOSIS — S270XXA Traumatic pneumothorax, initial encounter: Principal | ICD-10-CM | POA: Insufficient documentation

## 2016-10-08 DIAGNOSIS — Z23 Encounter for immunization: Secondary | ICD-10-CM | POA: Insufficient documentation

## 2016-10-08 DIAGNOSIS — F17210 Nicotine dependence, cigarettes, uncomplicated: Secondary | ICD-10-CM | POA: Insufficient documentation

## 2016-10-08 DIAGNOSIS — J939 Pneumothorax, unspecified: Secondary | ICD-10-CM

## 2016-10-08 HISTORY — DX: Other specified health status: Z78.9

## 2016-10-08 LAB — COMPREHENSIVE METABOLIC PANEL
ALBUMIN: 4.4 g/dL (ref 3.5–5.0)
ALK PHOS: 74 U/L (ref 38–126)
ALT: 52 U/L (ref 17–63)
AST: 41 U/L (ref 15–41)
Anion gap: 14 (ref 5–15)
BILIRUBIN TOTAL: 0.5 mg/dL (ref 0.3–1.2)
BUN: 10 mg/dL (ref 6–20)
CALCIUM: 9.7 mg/dL (ref 8.9–10.3)
CO2: 22 mmol/L (ref 22–32)
Chloride: 102 mmol/L (ref 101–111)
Creatinine, Ser: 1.2 mg/dL (ref 0.61–1.24)
GFR calc Af Amer: >60 mL/min (ref >60)
GFR calc non Af Amer: >60 mL/min (ref >60)
GLUCOSE: 111 mg/dL — AB (ref 65–99)
Potassium: 4.2 mmol/L (ref 3.5–5.1)
Sodium: 138 mmol/L (ref 135–145)
TOTAL PROTEIN: 8.1 g/dL (ref 6.5–8.1)

## 2016-10-08 LAB — PREPARE FRESH FROZEN PLASMA
UNIT DIVISION: 0
Unit division: 0

## 2016-10-08 LAB — CBC
HEMATOCRIT: 44.5 % (ref 39.0–52.0)
Hemoglobin: 15.7 g/dL (ref 13.0–17.0)
MCH: 29.4 pg (ref 26.0–34.0)
MCHC: 35.3 g/dL (ref 30.0–36.0)
MCV: 83.3 fL (ref 78.0–100.0)
Platelets: 253 10*3/uL (ref 150–400)
RBC: 5.34 MIL/uL (ref 4.22–5.81)
RDW: 13 % (ref 11.5–15.5)
WBC: 19.2 10*3/uL — ABNORMAL HIGH (ref 4.0–10.5)

## 2016-10-08 LAB — TYPE AND SCREEN
ABO/RH(D): B POS
ANTIBODY SCREEN: NEGATIVE
UNIT DIVISION: 0
Unit division: 0

## 2016-10-08 LAB — ABO/RH: ABO/RH(D): B POS

## 2016-10-08 LAB — ETHANOL: Alcohol, Ethyl (B): <5 mg/dL (ref <5)

## 2016-10-08 LAB — PROTIME-INR
INR: 1.01
PROTHROMBIN TIME: 13.3 s (ref 11.4–15.2)

## 2016-10-08 LAB — CDS SEROLOGY

## 2016-10-08 MED ORDER — ONDANSETRON HCL 4 MG/2ML IJ SOLN
4.0000 mg | Freq: Four times a day (QID) | INTRAMUSCULAR | Status: DC | PRN
  Administered 2016-10-08: 4 mg via INTRAVENOUS
  Filled 2016-10-08: qty 2

## 2016-10-08 MED ORDER — TETANUS-DIPHTH-ACELL PERTUSSIS 5-2.5-18.5 LF-MCG/0.5 IM SUSP
0.5000 mL | Freq: Once | INTRAMUSCULAR | Status: AC
  Administered 2016-10-08: 0.5 mL via INTRAMUSCULAR
  Filled 2016-10-08: qty 0.5

## 2016-10-08 MED ORDER — IOPAMIDOL (ISOVUE-370) INJECTION 76%
INTRAVENOUS | Status: AC
  Administered 2016-10-08: 100 mL
  Filled 2016-10-08: qty 100

## 2016-10-08 MED ORDER — ONDANSETRON HCL 4 MG/2ML IJ SOLN: 4.0000 mg | Freq: Once | INTRAMUSCULAR | Status: DC

## 2016-10-08 MED ORDER — SODIUM CHLORIDE 0.9% FLUSH
3.0000 mL | Freq: Two times a day (BID) | INTRAVENOUS | Status: DC
  Administered 2016-10-08 (×2): 3 mL via INTRAVENOUS

## 2016-10-08 MED ORDER — HYDROMORPHONE HCL 2 MG/ML IJ SOLN: 2.0000 mg | Freq: Once | INTRAMUSCULAR | Status: DC

## 2016-10-08 MED ORDER — HYDROMORPHONE HCL 2 MG/ML IJ SOLN
1.0000 mg | INTRAMUSCULAR | Status: DC | PRN
  Administered 2016-10-08 (×4): 2 mg via INTRAVENOUS
  Filled 2016-10-08 (×4): qty 1

## 2016-10-08 MED ORDER — SODIUM CHLORIDE 0.9% FLUSH: 3.0000 mL | INTRAVENOUS | Status: DC | PRN

## 2016-10-08 MED ORDER — SODIUM CHLORIDE 0.9 % IV SOLN: 250.0000 mL | INTRAVENOUS | Status: DC | PRN

## 2016-10-08 MED ORDER — PNEUMOCOCCAL VAC POLYVALENT 25 MCG/0.5ML IJ INJ
0.5000 mL | INJECTION | INTRAMUSCULAR | Status: AC
  Administered 2016-10-09: 0.5 mL via INTRAMUSCULAR
  Filled 2016-10-08: qty 0.5

## 2016-10-08 MED ORDER — ONDANSETRON HCL 4 MG PO TABS: 4.0000 mg | ORAL_TABLET | Freq: Four times a day (QID) | ORAL | Status: DC | PRN

## 2016-10-08 MED ORDER — OXYCODONE HCL 5 MG PO TABS
10.0000 mg | ORAL_TABLET | ORAL | Status: DC | PRN
  Administered 2016-10-08 – 2016-10-09 (×2): 10 mg via ORAL
  Filled 2016-10-08 (×2): qty 2
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
## 2016-10-08 NOTE — ED Notes (Signed)
Unsuccessful attempt at giving report to 6N. 
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
## 2016-10-09 ENCOUNTER — Observation Stay (HOSPITAL_COMMUNITY): Payer: Self-pay

## 2016-10-09 MED ORDER — HYDROCODONE-ACETAMINOPHEN 5-325 MG PO TABS: 1.0000 | ORAL_TABLET | Freq: Four times a day (QID) | ORAL | 0 refills | Status: AC | PRN

## 2016-10-09 NOTE — Progress Notes (Signed)
D/C papers gone over with pt. Prescription given to pt. IV taken out. Pneumonia vaccine given to pt. Pt.d/c'd successfully.

## 2016-10-09 NOTE — Discharge Instructions (Signed)
[facility] - DISCHARGE INSTRUCTIONS TO PATIENT  Activity:  Driving - May drive in 2 or 3 days, if off pain meds and pain controlled   Lifting - No limit  Wound Care:   Wash wound twice a day for at least one week.  Diet:  As tolerated  Follow up appointment:  No follow up needed.  But the right chest pain may take 3 to 4 weeks to resolve.  Call [facility] at [phone] for any question.  Medications and dosages:  Resume your home medications.  You have a prescription for:  Vicodin for pain.  You may also take ibuprofen or Aleve.  Call the [facility] office  [phone]) if you have:  Severe uncontrolled pain,  Redness, tenderness, or signs of infection (pain, swelling, redness, odor or green/yellow discharge around the site),  Difficulty breathing, headache or visual disturbances,  Any other questions or concerns you may have after discharge.  In an emergency, call 911 or go to an Emergency Department at a nearby hospital.
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
## 2016-10-11 MED FILL — HYDROCODON-APAP 5-325: 5-325 | 3 days supply | Qty: 20 | Fill #0
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
## 2021-12-18 ENCOUNTER — Ambulatory Visit (HOSPITAL_COMMUNITY)
Admission: RE | Admit: 2021-12-18 | Discharge: 2021-12-18 | Disposition: A | Discharge status: Home / Self Care | Payer: Self-pay | Source: Ambulatory Visit | Attending: Family Medicine | Admitting: Family Medicine

## 2021-12-18 ENCOUNTER — Encounter (HOSPITAL_COMMUNITY): Payer: Self-pay

## 2021-12-18 ENCOUNTER — Other Ambulatory Visit: Payer: Self-pay

## 2021-12-18 ENCOUNTER — Telehealth (HOSPITAL_COMMUNITY): Payer: Self-pay

## 2021-12-18 VITALS — BP 117/73 | HR 86 | Temp 100.3°F | Resp 18

## 2021-12-18 DIAGNOSIS — K0889 Other specified disorders of teeth and supporting structures: Secondary | ICD-10-CM

## 2021-12-18 MED ORDER — HYDROCODONE-ACETAMINOPHEN 7.5-325 MG PO TABS: 1.0000 | ORAL_TABLET | Freq: Four times a day (QID) | ORAL | 0 refills | Status: AC | PRN

## 2021-12-18 MED ORDER — CLINDAMYCIN HCL 300 MG PO CAPS: 300.0000 mg | ORAL_CAPSULE | Freq: Three times a day (TID) | ORAL | 0 refills | Status: AC

## 2021-12-18 MED ORDER — HYDROCODONE-ACETAMINOPHEN 7.5-325 MG PO TABS: 1.0000 | ORAL_TABLET | Freq: Four times a day (QID) | ORAL | 0 refills | Status: DC | PRN

## 2021-12-18 NOTE — Telephone Encounter (Signed)
Pt called as askeD to hydrocodone change to [facility], as [facility] are out.  ? ?I called [facility] [place] and cancelled the hydrocodone sent electronically.  ? ?Dr. Tracie Harrier will send hydrocodone, [place].  ?

## 2021-12-18 NOTE — Discharge Instructions (Addendum)

## 2021-12-18 NOTE — ED Triage Notes (Signed)
Patient has dental pain on right side of face.  Patient has swelling to right side of face.  Reports upper , right teeth are painful and reports having a whole in tooth ?
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
